# Patient Record
Sex: Male | Born: 1990 | Race: White | Hispanic: No | Marital: Married | Smoking: Current every day smoker
Health system: Southern US, Community
[De-identification: ages and names within clinical notes are randomized; demographics above are authoritative.]

## PROBLEM LIST (undated history)

## (undated) DIAGNOSIS — M109 Gout, unspecified: Secondary | ICD-10-CM

## (undated) DIAGNOSIS — J45909 Unspecified asthma, uncomplicated: Secondary | ICD-10-CM

## (undated) DIAGNOSIS — J42 Unspecified chronic bronchitis: Secondary | ICD-10-CM

## (undated) DIAGNOSIS — I10 Essential (primary) hypertension: Secondary | ICD-10-CM

---

## 2015-07-25 ENCOUNTER — Emergency Department (HOSPITAL_COMMUNITY)
Admission: EM | Admit: 2015-07-25 | Discharge: 2015-07-25 | Disposition: A | Discharge status: Home / Self Care | Payer: Self-pay | Attending: Emergency Medicine | Admitting: Emergency Medicine

## 2015-07-25 ENCOUNTER — Encounter (HOSPITAL_COMMUNITY): Payer: Self-pay | Admitting: Emergency Medicine

## 2015-07-25 DIAGNOSIS — M109 Gout, unspecified: Secondary | ICD-10-CM | POA: Insufficient documentation

## 2015-07-25 DIAGNOSIS — M25571 Pain in right ankle and joints of right foot: Secondary | ICD-10-CM | POA: Insufficient documentation

## 2015-07-25 HISTORY — DX: Gout, unspecified: M10.9

## 2015-07-25 MED ORDER — PREDNISONE 10 MG (21) PO TBPK
10.0000 mg | ORAL_TABLET | Freq: Every day | ORAL | Status: DC
Start: 2015-07-25 — End: 2019-11-18

## 2015-07-25 MED ORDER — PREDNISONE 50 MG PO TABS
50.0000 mg | ORAL_TABLET | Freq: Once | ORAL | Status: AC
  Administered 2015-07-25: 50 mg via ORAL
  Filled 2015-07-25: qty 1

## 2015-07-25 MED ORDER — NAPROXEN 500 MG PO TABS: 500.0000 mg | ORAL_TABLET | Freq: Two times a day (BID) | ORAL | Status: DC

## 2015-07-25 MED ORDER — OXYCODONE-ACETAMINOPHEN 5-325 MG PO TABS
1.0000 | ORAL_TABLET | Freq: Once | ORAL | Status: AC
  Administered 2015-07-25: 1 via ORAL
  Filled 2015-07-25: qty 1

## 2015-07-25 NOTE — ED Notes (Signed)
Patient reports hx of gout. Denies injury. No swelling/obvious deformity noted.

## 2015-07-25 NOTE — Discharge Instructions (Signed)
Ankle Pain Follow up with your primary care physician. Take naproxen for pain. Ankle pain is a common symptom. The bones, cartilage, tendons, and muscles of the ankle joint perform a lot of work each day. The ankle joint holds your body weight and allows you to move around. Ankle pain can occur on either side or back of 1 or both ankles. Ankle pain may be sharp and burning or dull and aching. There may be tenderness, stiffness, redness, or warmth around the ankle. The pain occurs more often when a person walks or puts pressure on the ankle. CAUSES  There are many reasons ankle pain can develop. It is important to work with your caregiver to identify the cause since many conditions can impact the bones, cartilage, muscles, and tendons. Causes for ankle pain include:  Injury, including a break (fracture), sprain, or strain often due to a fall, sports, or a high-impact activity.  Swelling (inflammation) of a tendon (tendonitis).  Achilles tendon rupture.  Ankle instability after repeated sprains and strains.  Poor foot alignment.  Pressure on a nerve (tarsal tunnel syndrome).  Arthritis in the ankle or the lining of the ankle.  Crystal formation in the ankle (gout or pseudogout). DIAGNOSIS  A diagnosis is based on your medical history, your symptoms, results of your physical exam, and results of diagnostic tests. Diagnostic tests may include X-ray exams or a computerized magnetic scan (magnetic resonance imaging, MRI). TREATMENT  Treatment will depend on the cause of your ankle pain and may include:  Keeping pressure off the ankle and limiting activities.  Using crutches or other walking support (a cane or brace).  Using rest, ice, compression, and elevation.  Participating in physical therapy or home exercises.  Wearing shoe inserts or special shoes.  Losing weight.  Taking medications to reduce pain or swelling or receiving an injection.  Undergoing surgery. HOME CARE  INSTRUCTIONS   Only take over-the-counter or prescription medicines for pain, discomfort, or fever as directed by your caregiver.  Put ice on the injured area.  Put ice in a plastic bag.  Place a towel between your skin and the bag.  Leave the ice on for 15-20 minutes at a time, 03-04 times a day.  Keep your leg raised (elevated) when possible to lessen swelling.  Avoid activities that cause ankle pain.  Follow specific exercises as directed by your caregiver.  Record how often you have ankle pain, the location of the pain, and what it feels like. This information may be helpful to you and your caregiver.  Ask your caregiver about returning to work or sports and whether you should drive.  Follow up with your caregiver for further examination, therapy, or testing as directed. SEEK MEDICAL CARE IF:   Pain or swelling continues or worsens beyond 1 week.  You have an oral temperature above 102 F (38.9 C).  You are feeling unwell or have chills.  You are having an increasingly difficult time with walking.  You have loss of sensation or other new symptoms.  You have questions or concerns. MAKE SURE YOU:   Understand these instructions.  Will watch your condition.  Will get help right away if you are not doing well or get worse. Document Released: 04/20/2010 Document Revised: 01/23/2012 Document Reviewed: 04/20/2010 [facility] Patient Information 2015 [place], [place]. This information is not intended to replace advice given to you by your health care provider. Make sure you discuss any questions you have with your health care provider.

## 2015-07-25 NOTE — ED Notes (Signed)
Right ankle pain, denies injury, hx of gout

## 2015-07-25 NOTE — ED Notes (Signed)
Instructed patient not to drive. Verbalizes understanding. 

## 2015-07-25 NOTE — ED Provider Notes (Signed)
CSN: [id number]     Arrival date & time 07/25/15  1520 History   First MD Initiated Contact with Patient 07/25/15 1532     Chief Complaint  Patient presents with  . Ankle Pain     (Consider location/radiation/quality/duration/timing/severity/associated sxs/prior Treatment) Patient is a 24 y.o. male presenting with ankle pain. The history is provided by the patient. No language interpreter was used.  Ankle Pain  Nicholas [name] is a 24 y.o male with a history of gout who presents for sudden onset right ankle pain after stepping out of bed this morning. Nicholas [name] states Nicholas [name] has had this pain before and this is how gout began. Nicholas [name] denies any recent injury or fall to the area. Nicholas [name] took ibuprofen this morning with minimal relief. Past Medical History  Diagnosis Date  . Gout    History reviewed. No pertinent past surgical history. No family history on file. Social History  Substance Use Topics  . Smoking status: Never Smoker   . Smokeless tobacco: None  . Alcohol Use: Yes     Comment: occ    Review of Systems  Musculoskeletal: Positive for arthralgias.  Skin: Negative for color change and rash.  Neurological: Negative for numbness.      Allergies  Review of patient's allergies indicates not on file.  Home Medications   Prior to Admission medications   Medication Sig Start Date End Date Taking? Authorizing Provider  naproxen (NAPROSYN) 500 MG tablet Take 1 tablet (500 mg total) by mouth 2 (two) times daily. 07/25/15   [place] Gustafson Patel-Mills, PA-C  predniSONE (STERAPRED UNI-PAK 21 TAB) 10 MG (21) TBPK tablet Take 1 tablet (10 mg total) by mouth daily. Take 6 tabs by mouth daily  for 2 days, then 4 tabs for 2 days, 2 tabs for 2 days, then 1 tab by mouth daily for 2 days 07/25/15   Caedyn Raygoza Patel-Mills, PA-C   BP 140/67 mmHg  Pulse 90  Temp(Src) 98.6 F (37 C)  Resp 18  Ht 6' (1.829 m)  Wt 265 lb (120.203 kg)  BMI 35.93 kg/m2  SpO2 100% Physical Exam  Constitutional: Nicholas [name] is oriented to person,  place, and time. Nicholas [name] appears well-developed and well-nourished.  HENT:  Head: Normocephalic and atraumatic.  Eyes: Conjunctivae are normal.  Neck: Neck supple.  Cardiovascular: Normal rate.   Pulmonary/Chest: Effort normal. No respiratory distress.  Musculoskeletal: Normal range of motion.  Right ankle: Tenderness to deep palpation of the lateral malleolus. No edema, erythema, ecchymosis, warmth or rash. Nicholas [name] is able to flex and extend his toes. Nicholas [name] is able to dorsi and plantar flex the ankle without difficulty. 2+ DP pulse. Less than 2 second capillary refill. No deformity. Nicholas [name] has no pain of the calcaneus, navicular, fifth metatarsal, or dorsum of the foot.  Neurological: Nicholas [name] is alert and oriented to person, place, and time.  Skin: Skin is warm and dry.  Psychiatric: Nicholas [name] has a normal mood and affect. His behavior is normal.  Nursing note and vitals reviewed.   ED Course  Procedures (including critical care time) Labs Review Labs Reviewed - No data to display  Imaging Review No results found.   EKG Interpretation None      MDM   Final diagnoses:  Acute right ankle pain   Patient presents for right ankle pain that began suddenly this morning when stepping out of bed. Nicholas [name] is well-appearing and in no acute distress. I do not believe imaging is necessary at this time. Nicholas [name] is ambulatory with steady gait.  This could possibly be a ligament sprain versus gout. Nicholas [name] states Nicholas [name] had gout in the same location previously. Patient was not given ankle ASO due to pain with compression. I discussed that Nicholas [name] would need to take anti-inflammatory's for pain. Return precautions were given. Nicholas [name] can follow up with his PCP and verbally agrees with the plan.     Catha Gosselin, PA-C 07/25/15 1557  Raeford Razor, MD 07/27/15 0800

## 2019-08-23 ENCOUNTER — Other Ambulatory Visit: Payer: Self-pay

## 2019-08-23 ENCOUNTER — Encounter (HOSPITAL_COMMUNITY): Payer: Self-pay

## 2019-08-23 ENCOUNTER — Emergency Department (HOSPITAL_COMMUNITY)
Admission: EM | Admit: 2019-08-23 | Discharge: 2019-08-23 | Disposition: A | Discharge status: Home / Self Care | Payer: Self-pay | Attending: Emergency Medicine | Admitting: Emergency Medicine

## 2019-08-23 DIAGNOSIS — M791 Myalgia, unspecified site: Secondary | ICD-10-CM | POA: Insufficient documentation

## 2019-08-23 DIAGNOSIS — R1084 Generalized abdominal pain: Secondary | ICD-10-CM | POA: Insufficient documentation

## 2019-08-23 DIAGNOSIS — F1910 Other psychoactive substance abuse, uncomplicated: Secondary | ICD-10-CM

## 2019-08-23 DIAGNOSIS — R5383 Other fatigue: Secondary | ICD-10-CM | POA: Insufficient documentation

## 2019-08-23 DIAGNOSIS — R112 Nausea with vomiting, unspecified: Secondary | ICD-10-CM

## 2019-08-23 LAB — COMPREHENSIVE METABOLIC PANEL
ALT: 26 U/L (ref 0–44)
AST: 22 U/L (ref 15–41)
Albumin: 4.2 g/dL (ref 3.5–5.0)
Alkaline Phosphatase: 66 U/L (ref 38–126)
Anion gap: 10 (ref 5–15)
BUN: 11 mg/dL (ref 6–20)
CO2: 26 mmol/L (ref 22–32)
Calcium: 9 mg/dL (ref 8.9–10.3)
Chloride: 102 mmol/L (ref 98–111)
Creatinine, Ser: 0.85 mg/dL (ref 0.61–1.24)
GFR calc Af Amer: >60 mL/min (ref >60)
GFR calc non Af Amer: >60 mL/min (ref >60)
Glucose, Bld: 90 mg/dL (ref 70–99)
Potassium: 4.3 mmol/L (ref 3.5–5.1)
Sodium: 138 mmol/L (ref 135–145)
Total Bilirubin: 1 mg/dL (ref 0.3–1.2)
Total Protein: 7.5 g/dL (ref 6.5–8.1)

## 2019-08-23 LAB — CBC
HCT: 50.4 % (ref 39.0–52.0)
Hemoglobin: 17 g/dL (ref 13.0–17.0)
MCH: 30.4 pg (ref 26.0–34.0)
MCHC: 33.7 g/dL (ref 30.0–36.0)
MCV: 90 fL (ref 80.0–100.0)
Platelets: 272 10*3/uL (ref 150–400)
RBC: 5.6 MIL/uL (ref 4.22–5.81)
RDW: 13.2 % (ref 11.5–15.5)
WBC: 9.1 10*3/uL (ref 4.0–10.5)
nRBC: 0 % (ref 0.0–0.2)

## 2019-08-23 LAB — LIPASE, BLOOD: Lipase: 26 U/L (ref 11–51)

## 2019-08-23 LAB — MAGNESIUM: Magnesium: 2.3 mg/dL (ref 1.7–2.4)

## 2019-08-23 MED ORDER — PROMETHAZINE HCL 25 MG PO TABS: 25.0000 mg | ORAL_TABLET | Freq: Four times a day (QID) | ORAL | 0 refills | Status: DC | PRN

## 2019-08-23 MED ORDER — LORAZEPAM 1 MG PO TABS
0.0000 mg | ORAL_TABLET | Freq: Four times a day (QID) | ORAL | Status: DC
  Administered 2019-08-23: 10:00:00 1 mg via ORAL
  Filled 2019-08-23: qty 1

## 2019-08-23 MED ORDER — VITAMIN B-1 100 MG PO TABS: 100.0000 mg | ORAL_TABLET | Freq: Every day | ORAL | Status: DC

## 2019-08-23 MED ORDER — SODIUM CHLORIDE 0.9 % IV BOLUS
1000.0000 mL | Freq: Once | INTRAVENOUS | Status: DC
Start: 2019-08-23 — End: 2019-08-23

## 2019-08-23 MED ORDER — METOCLOPRAMIDE HCL 5 MG/ML IJ SOLN
10.0000 mg | Freq: Once | INTRAMUSCULAR | Status: AC
  Administered 2019-08-23: 10:00:00 10 mg via INTRAVENOUS
  Filled 2019-08-23: qty 2

## 2019-08-23 MED ORDER — LORAZEPAM 1 MG PO TABS: 0.0000 mg | ORAL_TABLET | Freq: Two times a day (BID) | ORAL | Status: DC

## 2019-08-23 MED ORDER — M.V.I. ADULT IV INJ
INJECTION | Freq: Once | INTRAVENOUS | Status: AC
  Administered 2019-08-23: 10:00:00 via INTRAVENOUS
  Filled 2019-08-23: qty 1000

## 2019-08-23 MED ORDER — SODIUM CHLORIDE 0.9% FLUSH
3.0000 mL | Freq: Once | INTRAVENOUS | Status: AC
  Administered 2019-08-23: 3 mL via INTRAVENOUS

## 2019-08-23 MED ORDER — LORAZEPAM 2 MG/ML IJ SOLN: 0.0000 mg | Freq: Two times a day (BID) | INTRAMUSCULAR | Status: DC

## 2019-08-23 MED ORDER — LORAZEPAM 2 MG/ML IJ SOLN: 0.0000 mg | Freq: Four times a day (QID) | INTRAMUSCULAR | Status: DC

## 2019-08-23 MED ORDER — THIAMINE HCL 100 MG/ML IJ SOLN
100.0000 mg | Freq: Every day | INTRAMUSCULAR | Status: DC
  Administered 2019-08-23: 10:00:00 100 mg via INTRAVENOUS
  Filled 2019-08-23: qty 2

## 2019-08-23 NOTE — ED Triage Notes (Signed)
Patient c/o emesis since last night. Patient denies any diarrhea or abdominal pain.

## 2019-08-23 NOTE — ED Triage Notes (Signed)
Pt from rehab center - [facility]. Ambulatory and a&ox4.

## 2019-08-23 NOTE — Discharge Instructions (Addendum)
°  You were seen in the ED for nausea and vomiting after alcohol, cocaine, methamphetamine and marijuana cessation.   I suspect your symptoms are related to mild withdrawal symptoms. You had no signs of severe withdrawal   Use phenergan every 6 hours for nausea and vomiting. Stay hydrated.  Mild symptoms of withdrawal include nausea, vomiting, abdominal pain, headaches, tremors, sweats.  See resources attached, call and go to a facility that has inpatient and/or outpatient detox services   Return for severe headaches, vision changes, bright red blood or black vomit or stool, chest pain, shortness of breath, suicidal or homicidal thoughts, auditory or visual or tactile hallucinations, seizures, inability to control vomiting with nausea medicine  [facility] [address] [place], [place] [address] [place]  [place] [address] [place]   [address], [place] [address]  [phone]  [facility]  [address] [place], [place] [address]  [phone]

## 2019-08-23 NOTE — ED Notes (Signed)
Patient tolerated fluid challenge well. Given ativan PO.

## 2019-08-23 NOTE — ED Notes (Signed)
Pt denies any vomiting today. Pt states that he feels lethargic and achy. Pt reports that he took Meth, Cocaine, Ethol in the past 52. Pt reports that he entered a rehab yesterday [facility].

## 2019-08-23 NOTE — ED Provider Notes (Signed)
[facility]-EMERGENCY DEPT Provider Note   CSN: [id number] Arrival date & time: 08/23/19  2119     History   Chief Complaint Chief Complaint  Patient presents with  . Emesis    HPI Nicholas [name] is a 28 y.o. male with history of polysubstance abuse presents the ER for evaluation of nausea associated with vomiting and mild generalized abdominal soreness.  He began vomited 24 hours ago, last emesis at 1 AM.  He last ate lunch around noon yesterday but unable to keep anything down since then.  He thinks his abdominal soreness is from dry heaving.  No abdominal pain currently.  He thinks he is withdrawing from "everything".  States he was clean for a little over a year ago but unfortunately relapsed 3 months ago.  He has been using alcohol, cocaine, methamphetamines and marijuana.  No IV drug use.  He [facility] into [facility] yesterday and has been there for 1 day.  They do not do medical withdrawal treatment there.  He has had withdrawals in the past states before he used larger quantities of alcohol and states his withdrawals in the past were much worse than today.  He has had tremors and sweats.  He has never had hallucinations or seizures in the past when withdrawing.  Last used EtOH, marijuana, methamphetamines and cocaine 2 days ago. No headaches, tremors, hallucinations, seizures.   No associated fever, chest pain, shortness of breath, diarrhea, constipation, dysuria or current abdominal pain.  No hematemesis or melena.  No sick contacts.  No exposure to suspicious food.  No exposure to COVID-19 that he knows of.  No GI history in the past including surgeries.    HPI  Past Medical History:  Diagnosis Date  . Gout     There are no active problems to display for this patient.   History reviewed. No pertinent surgical history.      Home Medications    Prior to Admission medications   Medication Sig Start Date End Date Taking? Authorizing  Provider  naproxen (NAPROSYN) 500 MG tablet Take 1 tablet (500 mg total) by mouth 2 (two) times daily. Patient not taking: Reported on 08/23/2019 07/25/15   Patel-Mills, Lorelle Formosa, PA-C  predniSONE (STERAPRED UNI-PAK 21 TAB) 10 MG (21) TBPK tablet Take 1 tablet (10 mg total) by mouth daily. Take 6 tabs by mouth daily  for 2 days, then 4 tabs for 2 days, 2 tabs for 2 days, then 1 tab by mouth daily for 2 days Patient not taking: Reported on 08/23/2019 07/25/15   Patel-Mills, Lorelle Formosa, PA-C  promethazine (PHENERGAN) 25 MG tablet Take 1 tablet (25 mg total) by mouth every 6 (six) hours as needed for nausea or vomiting. 08/23/19   Liberty Handy, PA-C    Family History Family History  Problem Relation Age of Onset  . Hypertension Father     Social History Social History   Tobacco Use  . Smoking status: Never Smoker  . Smokeless tobacco: Never Used  Substance Use Topics  . Alcohol use: Yes    Comment: occ  . Drug use: Yes    Types: Marijuana, Cocaine, Methamphetamines    Comment: last usesd 3 days ago.     Allergies   Patient has no known allergies.   Review of Systems Review of Systems  Gastrointestinal: Positive for abdominal pain (resolved), nausea and vomiting.  All other systems reviewed and are negative.  Physical Exam Updated Vital Signs BP 127/75   Pulse  81   Temp 98.8 F (37.1 C) (Oral)   Resp 18   Ht 6' (1.829 m)   Wt 122.5 kg   SpO2 98%   BMI 36.62 kg/m   Physical Exam Vitals signs and nursing note reviewed.  Constitutional:      Appearance: He is well-developed.     Comments: Non toxic.  HENT:     Head: Normocephalic and atraumatic.     Nose: Nose normal.     Mouth/Throat:     Comments: MMM Eyes:     Conjunctiva/sclera: Conjunctivae normal.  Neck:     Musculoskeletal: Normal range of motion.  Cardiovascular:     Rate and Rhythm: Normal rate and regular rhythm.     Heart sounds: Normal heart sounds.  Pulmonary:     Effort: Pulmonary effort is normal.      Breath sounds: Normal breath sounds.  Abdominal:     General: Bowel sounds are normal.     Palpations: Abdomen is soft.     Tenderness: There is no abdominal tenderness.     Comments: No G/R/R. No suprapubic or CVA tenderness. Negative Murphy's and McBurney's  Musculoskeletal: Normal range of motion.  Skin:    General: Skin is warm and dry.     Capillary Refill: Capillary refill takes less than 2 seconds.     Comments: No diaphoresis   Neurological:     Mental Status: He is alert and oriented to person, place, and time.     Comments: No tremors   Psychiatric:        Behavior: Behavior normal.      ED Treatments / Results  Labs (all labs ordered are listed, but only abnormal results are displayed) Labs Reviewed  LIPASE, BLOOD  COMPREHENSIVE METABOLIC PANEL  CBC  MAGNESIUM    EKG None  Radiology No results found.  Procedures Procedures (including critical care time)  Medications Ordered in ED Medications  LORazepam (ATIVAN) injection 0-4 mg ( Intravenous See Alternative 08/23/19 1011)    Or  LORazepam (ATIVAN) tablet 0-4 mg (1 mg Oral Given 08/23/19 1011)  LORazepam (ATIVAN) injection 0-4 mg (has no administration in time range)    Or  LORazepam (ATIVAN) tablet 0-4 mg (has no administration in time range)  thiamine (VITAMIN B-1) tablet 100 mg ( Oral See Alternative 08/23/19 1013)    Or  thiamine (B-1) injection 100 mg (100 mg Intravenous Given 08/23/19 1013)  sodium chloride flush (NS) 0.9 % injection 3 mL (3 mLs Intravenous Given 08/23/19 1010)  metoCLOPramide (REGLAN) injection 10 mg (10 mg Intravenous Given 08/23/19 1012)  sodium chloride 0.9 % 1,000 mL with thiamine 100 mg, folic acid 1 mg, multivitamins adult 10 mL infusion ( Intravenous Stopped 08/23/19 1221)     Initial Impression / Assessment and Plan / ED Course  I have reviewed the triage vital signs and the nursing notes.  Pertinent labs & imaging results that were available during my care of the  patient were reviewed by me and considered in my medical decision making (see chart for details).  Patient EMR reviewed.   Highest on ddx is polysubstance withdrawal syndrome, mild. Last use 48 hours ago. History of the same in the past. No history of seizures, hallucinations or severe withdrawals.  Could be viral gastroenteritis.  No exposure to COVID, sick contacts.  Hx and exam not consistent with intraabdominal emergency like cholecystitis, pancreatitis, appendicitis, diverticulitis.  Exam reassuring. No tachycardia, hypertension, diaphoresis, severe dehydration, tremors.  MMM. No abd tenderness.  Will obtain labs. Give banana bag, antiemetics. On CIWA protocol. I don't think he needs bzd now. Will reassess.  1415: Labs reviewed by me unremarkable.  Patient has received banana bag.  Tolerating fluids without emesis.  CIWA score initially 5, decreased.  Patient reevaluated.  No other objective signs of withdrawal.  No signs of severe withdrawal symptoms.  Offered Librium but patient states facility does not accept this medicine.  Will discharge with antiemetic, oral hydration.  Return precautions discussed.  Patient is comfortable with this. Final Clinical Impressions(s) / ED Diagnoses   Final diagnoses:  Nausea and vomiting in adult patient  Polysubstance abuse [facility])    ED Discharge Orders         Ordered    promethazine (PHENERGAN) 25 MG tablet  Every 6 hours PRN     08/23/19 1152           Kinnie Feil, [place] 08/23/19 [place], Ankit, MD 08/23/19 1513

## 2019-08-30 ENCOUNTER — Emergency Department (HOSPITAL_COMMUNITY)
Admission: EM | Admit: 2019-08-30 | Discharge: 2019-08-30 | Disposition: A | Discharge status: Home / Self Care | Payer: Self-pay | Attending: Emergency Medicine | Admitting: Emergency Medicine

## 2019-08-30 ENCOUNTER — Encounter (HOSPITAL_COMMUNITY): Payer: Self-pay

## 2019-08-30 ENCOUNTER — Other Ambulatory Visit: Payer: Self-pay

## 2019-08-30 DIAGNOSIS — Y999 Unspecified external cause status: Secondary | ICD-10-CM | POA: Insufficient documentation

## 2019-08-30 DIAGNOSIS — X58XXXA Exposure to other specified factors, initial encounter: Secondary | ICD-10-CM | POA: Insufficient documentation

## 2019-08-30 DIAGNOSIS — Y939 Activity, unspecified: Secondary | ICD-10-CM | POA: Insufficient documentation

## 2019-08-30 DIAGNOSIS — S29012A Strain of muscle and tendon of back wall of thorax, initial encounter: Secondary | ICD-10-CM | POA: Insufficient documentation

## 2019-08-30 DIAGNOSIS — T148XXA Other injury of unspecified body region, initial encounter: Secondary | ICD-10-CM

## 2019-08-30 DIAGNOSIS — Y929 Unspecified place or not applicable: Secondary | ICD-10-CM | POA: Insufficient documentation

## 2019-08-30 NOTE — ED Triage Notes (Signed)
Patient reports that he is having lower back pain and reports an area of numbness of the lower back. Patient states the pain radiates into the top of his head. Patient denies any injuries or heavy lifting. MAE.

## 2019-08-30 NOTE — Discharge Instructions (Addendum)
Use ibuprofen as directed for pain 

## 2019-08-30 NOTE — ED Provider Notes (Signed)
[facility] DEPT Provider Note   CSN: [id number] Arrival date & time: 08/30/19  1209     History   Chief Complaint Chief Complaint  Patient presents with  . Back Pain    HPI Nicholas [name] is a 28 y.o. male.     28 year old male who presents with back pain times several days.  Pain is located at his left mid thoracic region and is worse with twisting.  No radicular symptoms.  No shortness of breath.  Pain is dull and better with remaining still.  No treatment use prior to arrival     Past Medical History:  Diagnosis Date  . Gout     There are no active problems to display for this patient.   History reviewed. No pertinent surgical history.      Home Medications    Prior to Admission medications   Medication Sig Start Date End Date Taking? Authorizing Provider  naproxen (NAPROSYN) 500 MG tablet Take 1 tablet (500 mg total) by mouth 2 (two) times daily. Patient not taking: Reported on 08/23/2019 07/25/15   Patel-Mills, Orvil Feil, PA-C  predniSONE (STERAPRED UNI-PAK 21 TAB) 10 MG (21) TBPK tablet Take 1 tablet (10 mg total) by mouth daily. Take 6 tabs by mouth daily  for 2 days, then 4 tabs for 2 days, 2 tabs for 2 days, then 1 tab by mouth daily for 2 days Patient not taking: Reported on 08/23/2019 07/25/15   Patel-Mills, Orvil Feil, PA-C  promethazine (PHENERGAN) 25 MG tablet Take 1 tablet (25 mg total) by mouth every 6 (six) hours as needed for nausea or vomiting. 08/23/19   Kinnie Feil, PA-C    Family History Family History  Problem Relation Age of Onset  . Hypertension Father     Social History Social History   Tobacco Use  . Smoking status: Never Smoker  . Smokeless tobacco: Never Used  Substance Use Topics  . Alcohol use: Yes    Comment: occ  . Drug use: Not Currently    Types: Marijuana, Cocaine, Methamphetamines     Allergies   Patient has no known allergies.   Review of Systems Review of Systems  All other  systems reviewed and are negative.    Physical Exam Updated Vital Signs BP 137/90 (BP Location: Right Arm)   Pulse 81   Temp 98.5 F (36.9 C) (Oral)   Resp 18   Ht 1.829 m (6')   Wt 122.5 kg   SpO2 99%   BMI 36.62 kg/m   Physical Exam Vitals signs and nursing note reviewed.  Constitutional:      General: He is not in acute distress.    Appearance: Normal appearance. He is well-developed. He is not toxic-appearing.  HENT:     Head: Normocephalic and atraumatic.  Eyes:     General: Lids are normal.     Conjunctiva/sclera: Conjunctivae normal.     Pupils: Pupils are equal, round, and reactive to light.  Neck:     Musculoskeletal: Normal range of motion and neck supple.     Thyroid: No thyroid mass.     Trachea: No tracheal deviation.  Cardiovascular:     Rate and Rhythm: Normal rate and regular rhythm.     Heart sounds: Normal heart sounds. No murmur. No gallop.   Pulmonary:     Effort: Pulmonary effort is normal. No respiratory distress.     Breath sounds: Normal breath sounds. No stridor. No decreased breath sounds, wheezing, rhonchi or rales.  Abdominal:     General: Bowel sounds are normal. There is no distension.     Palpations: Abdomen is soft.     Tenderness: There is no abdominal tenderness. There is no rebound.  Musculoskeletal: Normal range of motion.     Thoracic back: He exhibits tenderness. He exhibits no deformity.       Back:  Skin:    General: Skin is warm and dry.     Findings: No abrasion or rash.  Neurological:     Mental Status: He is alert and oriented to person, place, and time.     GCS: GCS eye subscore is 4. GCS verbal subscore is 5. GCS motor subscore is 6.     Cranial Nerves: No cranial nerve deficit.     Sensory: No sensory deficit.  Psychiatric:        Speech: Speech normal.        Behavior: Behavior normal.      ED Treatments / Results  Labs (all labs ordered are listed, but only abnormal results are displayed) Labs Reviewed -  No data to display  EKG None  Radiology No results found.  Procedures Procedures (including critical care time)  Medications Ordered in ED Medications - No data to display   Initial Impression / Assessment and Plan / ED Course  I have reviewed the triage vital signs and the nursing notes.  Pertinent labs & imaging results that were available during my care of the patient were reviewed by me and considered in my medical decision making (see chart for details).        Patient with MSK back pain.  Encourage patient to use ice therapy as well as ibuprofen return precautions given Final Clinical Impressions(s) / ED Diagnoses   Final diagnoses:  None    ED Discharge Orders    None       Lorre Nick, MD 08/30/19 1439

## 2019-09-27 ENCOUNTER — Encounter (HOSPITAL_COMMUNITY): Payer: Self-pay

## 2019-09-27 ENCOUNTER — Other Ambulatory Visit: Payer: Self-pay

## 2019-09-27 ENCOUNTER — Emergency Department (HOSPITAL_COMMUNITY)
Admission: EM | Admit: 2019-09-27 | Discharge: 2019-09-27 | Disposition: A | Discharge status: Home / Self Care | Payer: Self-pay | Attending: Emergency Medicine | Admitting: Emergency Medicine

## 2019-09-27 ENCOUNTER — Emergency Department (HOSPITAL_COMMUNITY): Payer: Self-pay

## 2019-09-27 DIAGNOSIS — Z79899 Other long term (current) drug therapy: Secondary | ICD-10-CM | POA: Insufficient documentation

## 2019-09-27 DIAGNOSIS — M7660 Achilles tendinitis, unspecified leg: Secondary | ICD-10-CM

## 2019-09-27 DIAGNOSIS — F17210 Nicotine dependence, cigarettes, uncomplicated: Secondary | ICD-10-CM | POA: Insufficient documentation

## 2019-09-27 NOTE — ED Triage Notes (Signed)
Patient c/o right foot pain x 3 days. Patient states he tore his achilles tendon several years ago. Pain is worse when he stands or walks.

## 2019-09-27 NOTE — ED Provider Notes (Signed)
[facility]-EMERGENCY DEPT Provider Note   CSN: [id number] Arrival date & time: 09/27/19  2952     History   Chief Complaint Chief Complaint  Patient presents with  . Foot Pain    HPI Nicholas [name] is a 28 y.o. male.     HPI Patient with right Achilles pain.  Has had for the last 3 days.  States years ago he tore his Achilles tendon playing football.  States he did not have to have surgery. Past Medical History:  Diagnosis Date  . Gout     There are no active problems to display for this patient.   History reviewed. No pertinent surgical history.      Home Medications    Prior to Admission medications   Medication Sig Start Date End Date Taking? Authorizing Provider  naproxen (NAPROSYN) 500 MG tablet Take 1 tablet (500 mg total) by mouth 2 (two) times daily. Patient not taking: Reported on 08/23/2019 07/25/15   Patel-Mills, Lorelle Formosa, PA-C  predniSONE (STERAPRED UNI-PAK 21 TAB) 10 MG (21) TBPK tablet Take 1 tablet (10 mg total) by mouth daily. Take 6 tabs by mouth daily  for 2 days, then 4 tabs for 2 days, 2 tabs for 2 days, then 1 tab by mouth daily for 2 days Patient not taking: Reported on 08/23/2019 07/25/15   Patel-Mills, Lorelle Formosa, PA-C  promethazine (PHENERGAN) 25 MG tablet Take 1 tablet (25 mg total) by mouth every 6 (six) hours as needed for nausea or vomiting. 08/23/19   Liberty Handy, PA-C    Family History Family History  Problem Relation Age of Onset  . Hypertension Father     Social History Social History   Tobacco Use  . Smoking status: Current Every Day Smoker    Packs/day: 0.50    Types: Cigarettes  . Smokeless tobacco: Never Used  Substance Use Topics  . Alcohol use: Yes    Comment: occ  . Drug use: Not Currently    Types: Marijuana, Cocaine, Methamphetamines     Allergies   Patient has no known allergies.   Review of Systems Review of Systems  Constitutional: Negative for appetite change.  Respiratory:  Negative for shortness of breath.   Musculoskeletal:       Right Achilles pain.  Neurological: Negative for weakness.  Psychiatric/Behavioral: Negative for confusion.     Physical Exam Updated Vital Signs BP (!) 131/95 (BP Location: Right Arm)   Pulse 82   Temp 98.3 F (36.8 C) (Oral)   Resp 14   Ht 6' (1.829 m)   Wt 127 kg   SpO2 100%   BMI 37.97 kg/m   Physical Exam Vitals signs and nursing note reviewed.  Musculoskeletal:     Comments: Mild tenderness of the right Achilles.  Good range of motion ankle.  Good flexion extension.  Good strength in plantar flexion at the ankle.  No tenderness over the calf.  Skin:    General: Skin is warm.  Neurological:     Mental Status: He is alert.      ED Treatments / Results  Labs (all labs ordered are listed, but only abnormal results are displayed) Labs Reviewed - No data to display  EKG None  Radiology Dg Foot Complete Right  Result Date: 09/27/2019 CLINICAL DATA:  Right foot pain EXAM: RIGHT FOOT COMPLETE - 3+ VIEW COMPARISON:  None. FINDINGS: There is no evidence of fracture or dislocation. There is no evidence of arthropathy or other focal bone abnormality. Soft  tissues are unremarkable. IMPRESSION: Negative. Electronically Signed   By: Franchot Gallo M.D.   On: 09/27/2019 09:45    Procedures Procedures (including critical care time)  Medications Ordered in ED Medications - No data to display   Initial Impression / Assessment and Plan / ED Course  I have reviewed the triage vital signs and the nursing notes.  Pertinent labs & imaging results that were available during my care of the patient were reviewed by me and considered in my medical decision making (see chart for details).        Patient with mild Achilles tendinitis.  Likely brought on due to the change in the weather from previous injury since there was no injury.  Think patient stable for discharge.  Outpatient follow-up as needed.  Final Clinical  Impressions(s) / ED Diagnoses   Final diagnoses:  Achilles tendon pain    ED Discharge Orders    None       Davonna Belling, MD 09/27/19 1055

## 2019-11-18 ENCOUNTER — Emergency Department
Admission: EM | Admit: 2019-11-18 | Discharge: 2019-11-18 | Disposition: A | Discharge status: Home / Self Care | Payer: Self-pay | Attending: Emergency Medicine | Admitting: Emergency Medicine

## 2019-11-18 ENCOUNTER — Encounter: Payer: Self-pay | Admitting: Emergency Medicine

## 2019-11-18 ENCOUNTER — Other Ambulatory Visit: Payer: Self-pay

## 2019-11-18 DIAGNOSIS — J45909 Unspecified asthma, uncomplicated: Secondary | ICD-10-CM | POA: Insufficient documentation

## 2019-11-18 DIAGNOSIS — B349 Viral infection, unspecified: Secondary | ICD-10-CM | POA: Insufficient documentation

## 2019-11-18 DIAGNOSIS — Z20822 Contact with and (suspected) exposure to covid-19: Secondary | ICD-10-CM | POA: Insufficient documentation

## 2019-11-18 DIAGNOSIS — F17210 Nicotine dependence, cigarettes, uncomplicated: Secondary | ICD-10-CM | POA: Insufficient documentation

## 2019-11-18 HISTORY — DX: Unspecified asthma, uncomplicated: J45.909

## 2019-11-18 HISTORY — DX: Unspecified chronic bronchitis: J42

## 2019-11-18 LAB — INFLUENZA PANEL BY PCR (TYPE A & B)
Influenza A By PCR: NEGATIVE
Influenza B By PCR: NEGATIVE

## 2019-11-18 MED ORDER — PREDNISONE 10 MG (21) PO TBPK: ORAL_TABLET | ORAL | 0 refills | Status: DC

## 2019-11-18 NOTE — ED Notes (Signed)
NAD noted at time of D/C. Pt denies questions or concerns. Pt ambulatory to the lobby at this time. Unable to E-sig due to E-sig pad not working, verbal consent for D/C obtained.

## 2019-11-18 NOTE — ED Provider Notes (Signed)
[facility] Emergency Department Provider Note  ____________________________________________  Time seen: Approximately 1:51 PM  I have reviewed the triage vital signs and the nursing notes.   HISTORY  Chief Complaint Generalized Body Aches and Shortness of Breath   HPI Ruddy Kana is a 29 y.o. male presents to the emergency department for treatment and evaluation of body aches and shortness of breath. Symptoms started last night. Little relief with albuterol inhaler. History of asthma and bronchitis.  No known COVID-19 exposure.   Past Medical History:  Diagnosis Date  . Asthma   . Chronic bronchitis (HCC)   . Gout     There are no problems to display for this patient.   History reviewed. No pertinent surgical history.  Prior to Admission medications   Medication Sig Start Date End Date Taking? Authorizing Provider  naproxen (NAPROSYN) 500 MG tablet Take 1 tablet (500 mg total) by mouth 2 (two) times daily. Patient not taking: Reported on 08/23/2019 07/25/15   Patel-Mills, Lorelle Formosa, PA-C  predniSONE (STERAPRED UNI-PAK 21 TAB) 10 MG (21) TBPK tablet Take 6 tablets on the first day and decrease by 1 tablet each day until finished. 11/18/19   Petronella Shuford, Kasandra Knudsen, FNP  promethazine (PHENERGAN) 25 MG tablet Take 1 tablet (25 mg total) by mouth every 6 (six) hours as needed for nausea or vomiting. 08/23/19   Liberty Handy, PA-C    Allergies Patient has no known allergies.  Family History  Problem Relation Age of Onset  . Hypertension Father     Social History Social History   Tobacco Use  . Smoking status: Current Every Day Smoker    Packs/day: 0.50    Types: Cigarettes  . Smokeless tobacco: Never Used  Substance Use Topics  . Alcohol use: Yes    Comment: occ  . Drug use: Not Currently    Types: Marijuana, Cocaine, Methamphetamines    Review of Systems Constitutional: Negative for fever/chills. normal appetite. ENT: No sore  throat. Cardiovascular: Denies chest pain. Respiratory: Positive for shortness of breath. Positive for cough. Negative for wheezing.  Gastrointestinal: Negative for nausea,  no vomiting.  no diarrhea.  Musculoskeletal: Positive  for body aches Skin: Neative for rash. Neurological: Negative for headaches ____________________________________________   PHYSICAL EXAM:  VITAL SIGNS: ED Triage Vitals [11/18/19 1305]  Enc Vitals Group     BP (!) 154/86     Pulse Rate 89     Resp 16     Temp 98.2 F (36.8 C)     Temp Source Oral     SpO2 100 %     Weight 250 lb (113.4 kg)     Height 6' (1.829 m)     Head Circumference      Peak Flow      Pain Score 0     Pain Loc      Pain Edu?      Excl. in GC?     Constitutional: Alert and oriented. Well appearing and in no acute distress. Eyes: Conjunctivae are normal. Ears: TM normal Nose: no sinus congestion noted; no rhinnorhea. Mouth/Throat: Mucous membranes are moist.  Oropharynx clear. Tonsils flat. Uvula midline. Neck: No stridor.  Lymphatic: No cervical lymphadenopathy. Cardiovascular: Normal rate, regular rhythm. Good peripheral circulation. Respiratory: Respirations are even and unlabored.  No retractions. Breath sounds clear. Gastrointestinal: Soft and nontender.  Musculoskeletal: FROM x 4 extremities.  Neurologic:  Normal speech and language. Skin:  Skin is warm, dry and intact. No rash noted. Psychiatric:  Mood and affect are normal. Speech and behavior are normal.  ____________________________________________   LABS (all labs ordered are listed, but only abnormal results are displayed)  Labs Reviewed  SARS CORONAVIRUS 2 (TAT 6-24 HRS)  INFLUENZA PANEL BY PCR (TYPE A & B)   ____________________________________________  EKG  Not indicated. ____________________________________________  RADIOLOGY  Not indicated. ____________________________________________   PROCEDURES  Procedure(s) performed:  None  Critical Care performed: No ____________________________________________   INITIAL IMPRESSION / ASSESSMENT AND PLAN / ED COURSE  29 y.o. male presents to the ER for treatment and evaluation of symptoms as described in HPI. He will be tested for influenza and COVID-19. Prescription for prednisone sent to his pharmacy. He is to continue using his inhaler if needed. He was given quarantine instructions. He was made aware to follow up with primary care or return to the ER for symptoms that change or worsen.   Gorman Safi was evaluated in Emergency Department on 11/18/2019 for the symptoms described in the history of present illness. He was evaluated in the context of the global COVID-19 pandemic, which necessitated consideration that the patient might be at risk for infection with the SARS-CoV-2 virus that causes COVID-19. Institutional protocols and algorithms that pertain to the evaluation of patients at risk for COVID-19 are in a state of rapid change based on information released by regulatory bodies including the CDC and federal and state organizations. These policies and algorithms were followed during the patient's care in the ED.     Medications - No data to display  ED Discharge Orders         Ordered    predniSONE (STERAPRED UNI-PAK 21 TAB) 10 MG (21) TBPK tablet     11/18/19 1400           Pertinent labs & imaging results that were available during my care of the patient were reviewed by me and considered in my medical decision making (see chart for details).    If controlled substance prescribed during this visit, 12 month history viewed on the [place] prior to issuing an initial prescription for Schedule II or III opiod. ____________________________________________   FINAL CLINICAL IMPRESSION(S) / ED DIAGNOSES  Final diagnoses:  Viral syndrome    Note:  This document was prepared using Dragon voice recognition software and may include unintentional dictation  errors.    Victorino Dike, FNP 11/18/19 1525    Arta Silence, MD 11/18/19 1529

## 2019-11-18 NOTE — ED Triage Notes (Signed)
Says started with short of breath last night and body aches.  No fever. Says he has asthma and chronic bronchitis history

## 2019-11-18 NOTE — ED Notes (Signed)
Per NP no repeat VS needed at this time.

## 2019-11-18 NOTE — ED Notes (Signed)
Pt presents to ED via POV with c/o generalized body aches and SOB that started last night, pt denies known covid exposure, states current smoker, hx of asthma and bronchitis. No dyspnea with exertion, RR even and unlabored, pt able to speak in complete sentences without difficulty at this time.

## 2019-11-19 LAB — SARS CORONAVIRUS 2 (TAT 6-24 HRS): SARS Coronavirus 2: NEGATIVE

## 2019-11-25 ENCOUNTER — Emergency Department (HOSPITAL_COMMUNITY)
Admission: EM | Admit: 2019-11-25 | Discharge: 2019-11-26 | Disposition: A | Discharge status: Home / Self Care | Payer: Self-pay | Attending: Emergency Medicine | Admitting: Emergency Medicine

## 2019-11-25 ENCOUNTER — Other Ambulatory Visit: Payer: Self-pay

## 2019-11-25 ENCOUNTER — Encounter (HOSPITAL_COMMUNITY): Payer: Self-pay

## 2019-11-25 DIAGNOSIS — Z79899 Other long term (current) drug therapy: Secondary | ICD-10-CM | POA: Insufficient documentation

## 2019-11-25 DIAGNOSIS — Z20822 Contact with and (suspected) exposure to covid-19: Secondary | ICD-10-CM | POA: Insufficient documentation

## 2019-11-25 DIAGNOSIS — J45909 Unspecified asthma, uncomplicated: Secondary | ICD-10-CM | POA: Insufficient documentation

## 2019-11-25 DIAGNOSIS — F1910 Other psychoactive substance abuse, uncomplicated: Secondary | ICD-10-CM | POA: Insufficient documentation

## 2019-11-25 DIAGNOSIS — F17210 Nicotine dependence, cigarettes, uncomplicated: Secondary | ICD-10-CM | POA: Insufficient documentation

## 2019-11-25 NOTE — ED Triage Notes (Signed)
Patient arrived stating that he was in withdrawal earlier and decided to use meth at 930pm. Reports coming here for detox. Denies any medical complaints or SI or HI

## 2019-11-26 LAB — SALICYLATE LEVEL: Salicylate Lvl: <7 mg/dL — ABNORMAL LOW (ref 7.0–30.0)

## 2019-11-26 LAB — RAPID URINE DRUG SCREEN, HOSP PERFORMED
Amphetamines: POSITIVE — AB
Barbiturates: NOT DETECTED
Benzodiazepines: NOT DETECTED
Cocaine: NOT DETECTED
Opiates: NOT DETECTED
Tetrahydrocannabinol: POSITIVE — AB

## 2019-11-26 LAB — COMPREHENSIVE METABOLIC PANEL
ALT: 25 U/L (ref 0–44)
AST: 23 U/L (ref 15–41)
Albumin: 5.1 g/dL — ABNORMAL HIGH (ref 3.5–5.0)
Alkaline Phosphatase: 69 U/L (ref 38–126)
Anion gap: 11 (ref 5–15)
BUN: 12 mg/dL (ref 6–20)
CO2: 25 mmol/L (ref 22–32)
Calcium: 9.4 mg/dL (ref 8.9–10.3)
Chloride: 104 mmol/L (ref 98–111)
Creatinine, Ser: 1.09 mg/dL (ref 0.61–1.24)
GFR calc Af Amer: >60 mL/min (ref >60)
GFR calc non Af Amer: >60 mL/min (ref >60)
Glucose, Bld: 99 mg/dL (ref 70–99)
Potassium: 3.6 mmol/L (ref 3.5–5.1)
Sodium: 140 mmol/L (ref 135–145)
Total Bilirubin: 1.3 mg/dL — ABNORMAL HIGH (ref 0.3–1.2)
Total Protein: 8.3 g/dL — ABNORMAL HIGH (ref 6.5–8.1)

## 2019-11-26 LAB — CBC
HCT: 48.8 % (ref 39.0–52.0)
Hemoglobin: 17.2 g/dL — ABNORMAL HIGH (ref 13.0–17.0)
MCH: 31.6 pg (ref 26.0–34.0)
MCHC: 35.2 g/dL (ref 30.0–36.0)
MCV: 89.5 fL (ref 80.0–100.0)
Platelets: 299 10*3/uL (ref 150–400)
RBC: 5.45 MIL/uL (ref 4.22–5.81)
RDW: 12.7 % (ref 11.5–15.5)
WBC: 17.2 10*3/uL — ABNORMAL HIGH (ref 4.0–10.5)
nRBC: 0 % (ref 0.0–0.2)

## 2019-11-26 LAB — ACETAMINOPHEN LEVEL: Acetaminophen (Tylenol), Serum: <10 ug/mL — ABNORMAL LOW (ref 10–30)

## 2019-11-26 LAB — ETHANOL: Alcohol, Ethyl (B): <10 mg/dL (ref <10)

## 2019-11-26 LAB — SARS CORONAVIRUS 2 (TAT 6-24 HRS): SARS Coronavirus 2: NEGATIVE

## 2019-11-26 MED ORDER — DIPHENOXYLATE-ATROPINE 2.5-0.025 MG PO TABS: 1.0000 | ORAL_TABLET | Freq: Four times a day (QID) | ORAL | 0 refills | Status: AC | PRN

## 2019-11-26 MED ORDER — DICYCLOMINE HCL 20 MG PO TABS: 20.0000 mg | ORAL_TABLET | Freq: Three times a day (TID) | ORAL | 0 refills | Status: AC | PRN

## 2019-11-26 MED ORDER — CLONIDINE HCL 0.2 MG PO TABS: 0.2000 mg | ORAL_TABLET | Freq: Two times a day (BID) | ORAL | 0 refills | Status: AC | PRN

## 2019-11-26 MED ORDER — PROMETHAZINE HCL 25 MG PO TABS: 25.0000 mg | ORAL_TABLET | Freq: Four times a day (QID) | ORAL | 0 refills | Status: AC | PRN

## 2019-11-26 NOTE — Discharge Instructions (Signed)
Your labs have been provided for you in case you need them for treatment.  Your Covid test should be available tomorrow, you can access it in your MyChart.

## 2019-11-26 NOTE — ED Provider Notes (Signed)
Nicholas [name]   CSN: [id number] Arrival date & time: 11/25/19  2248     History Chief Complaint  Patient presents with  . Detox    Nicholas [name] is a 29 y.o. male.  Patient presents to the emergency department asking for help with substance abuse.  Patient reports that he has a history of problems with crack cocaine and pain pills.  He has previously been through rehab.  He reports that he did have a year clean previously but has relapsed.  He was trying to get off the pain pills earlier tonight and started to have symptoms of withdrawal, used methamphetamine.  This is not a typical drug abuse for him.  Patient comes in tonight asking for help with detox.  He talked to some rehab centers and was told that he could come in if he can get through a detox program first.  Patient denies homicidality and suicidality.        Past Medical History:  Diagnosis Date  . Asthma   . Chronic bronchitis (HCC)   . Gout     There are no problems to display for this patient.   History reviewed. No pertinent surgical history.     Family History  Problem Relation Age of Onset  . Hypertension Father     Social History   Tobacco Use  . Smoking status: Current Every Day Smoker    Packs/day: 0.50    Types: Cigarettes  . Smokeless tobacco: Never Used  Substance Use Topics  . Alcohol use: Yes    Comment: occ  . Drug use: Not Currently    Types: Marijuana, Cocaine, Methamphetamines    Home Medications Prior to Admission medications   Medication Sig Start Date End Date Taking? Authorizing Provider  cloNIDine (CATAPRES) 0.2 MG tablet Take 1 tablet (0.2 mg total) by mouth 2 (two) times daily as needed (withdrawal symptoms). 11/26/19   Gilda Crease, MD  dicyclomine (BENTYL) 20 MG tablet Take 1 tablet (20 mg total) by mouth 3 (three) times daily as needed (abdominal pain/spasm). 11/26/19   Gilda Crease, MD   diphenoxylate-atropine (LOMOTIL) 2.5-0.025 MG tablet Take 1-2 tablets by mouth 4 (four) times daily as needed for diarrhea or loose stools. 11/26/19   Gilda Crease, MD  promethazine (PHENERGAN) 25 MG tablet Take 1 tablet (25 mg total) by mouth every 6 (six) hours as needed for nausea or vomiting. 11/26/19   Nicholas [name], Canary Brim, MD    Allergies    Patient has no known allergies.  Review of Systems   Review of Systems  Psychiatric/Behavioral: Negative for suicidal ideas.  All other systems reviewed and are negative.   Physical Exam Updated Vital Signs BP (!) 153/96 (BP Location: Left Arm)   Pulse (!) 118   Temp 98.7 F (37.1 C) (Oral)   Resp 17   Ht 6' (1.829 m)   Wt 113.4 kg   SpO2 97%   BMI 33.91 kg/m   Physical Exam Vitals and nursing [name] reviewed.  Constitutional:      General: He is not in acute distress.    Appearance: Normal appearance. He is well-developed.  HENT:     Head: Normocephalic and atraumatic.     Right Ear: Hearing normal.     Left Ear: Hearing normal.     Nose: Nose normal.  Eyes:     Conjunctiva/sclera: Conjunctivae normal.     Pupils: Pupils are equal, round, and reactive to light.  Cardiovascular:     Rate and Rhythm: Regular rhythm.     Heart sounds: S1 normal and S2 normal. No murmur. No friction rub. No gallop.   Pulmonary:     Effort: Pulmonary effort is normal. No respiratory distress.     Breath sounds: Normal breath sounds.  Chest:     Chest wall: No tenderness.  Abdominal:     General: Bowel sounds are normal.     Palpations: Abdomen is soft.     Tenderness: There is no abdominal tenderness. There is no guarding or rebound. Negative signs include Murphy's sign and McBurney's sign.     Hernia: No hernia is present.  Musculoskeletal:        General: Normal range of motion.     Cervical back: Normal range of motion and neck supple.  Skin:    General: Skin is warm and dry.     Findings: No rash.  Neurological:      Mental Status: He is alert and oriented to person, place, and time.     GCS: GCS eye subscore is 4. GCS verbal subscore is 5. GCS motor subscore is 6.     Cranial Nerves: No cranial nerve deficit.     Sensory: No sensory deficit.     Coordination: Coordination normal.  Psychiatric:        Speech: Speech normal.        Behavior: Behavior normal.        Thought Content: Thought content normal.     ED Results / Procedures / Treatments   Labs (all labs ordered are listed, but only abnormal results are displayed) Labs Reviewed  CBC - Abnormal; Notable for the following components:      Result Value   WBC 17.2 (*)    Hemoglobin 17.2 (*)    All other components within normal limits  COMPREHENSIVE METABOLIC PANEL - Abnormal; Notable for the following components:   Total Protein 8.3 (*)    Albumin 5.1 (*)    Total Bilirubin 1.3 (*)    All other components within normal limits  RAPID URINE DRUG SCREEN, HOSP PERFORMED - Abnormal; Notable for the following components:   Amphetamines POSITIVE (*)    Tetrahydrocannabinol POSITIVE (*)    All other components within normal limits  ACETAMINOPHEN LEVEL - Abnormal; Notable for the following components:   Acetaminophen (Tylenol), Serum <10 (*)    All other components within normal limits  SALICYLATE LEVEL - Abnormal; Notable for the following components:   Salicylate Lvl <6.7 (*)    All other components within normal limits  SARS CORONAVIRUS 2 (TAT 6-24 HRS)  ETHANOL    EKG None  Radiology No results found.  Procedures Procedures (including critical care time)  Medications Ordered in ED Medications - No data to display  ED Course  I have reviewed the triage vital signs and the nursing notes.  Pertinent labs & imaging results that were available during my care of the patient were reviewed by me and considered in my medical decision making (see chart for details).    MDM Rules/Calculators/A&P                      Patient  presents to the emergency department for evaluation of abuse of opioid pain medications as well as crack cocaine.  He did use methamphetamine tonight but this is not a normal drug of abuse for him.  He is asking for detox.  Patient was informed that I  do not have a way to place him directly in detox but he was amenable to receiving resources.  [facility] lab work was ordered and will be provided to him.  He will also be given prescriptions for symptomatic relief of opioid withdrawal. Final Clinical Impression(s) / ED Diagnoses Final diagnoses:  Polysubstance abuse (HCC)    Rx / DC Orders ED Discharge Orders         Ordered    diphenoxylate-atropine (LOMOTIL) 2.5-0.025 MG tablet  4 times daily PRN     11/26/19 0153    promethazine (PHENERGAN) 25 MG tablet  Every 6 hours PRN     11/26/19 0153    cloNIDine (CATAPRES) 0.2 MG tablet  2 times daily PRN     11/26/19 0153    dicyclomine (BENTYL) 20 MG tablet  3 times daily PRN     11/26/19 0153           Gilda Crease, MD 11/26/19 [phone]

## 2019-11-26 NOTE — ED Notes (Signed)
Pt attempting to give urine specimen

## 2019-12-10 ENCOUNTER — Emergency Department: Payer: Self-pay

## 2019-12-10 ENCOUNTER — Other Ambulatory Visit: Payer: Self-pay

## 2019-12-10 ENCOUNTER — Emergency Department
Admission: EM | Admit: 2019-12-10 | Discharge: 2019-12-10 | Disposition: A | Discharge status: Home / Self Care | Payer: Self-pay | Attending: Emergency Medicine | Admitting: Emergency Medicine

## 2019-12-10 ENCOUNTER — Encounter: Payer: Self-pay | Admitting: Emergency Medicine

## 2019-12-10 DIAGNOSIS — R519 Headache, unspecified: Secondary | ICD-10-CM | POA: Insufficient documentation

## 2019-12-10 DIAGNOSIS — Y9389 Activity, other specified: Secondary | ICD-10-CM | POA: Insufficient documentation

## 2019-12-10 DIAGNOSIS — Y929 Unspecified place or not applicable: Secondary | ICD-10-CM | POA: Insufficient documentation

## 2019-12-10 DIAGNOSIS — S60221A Contusion of right hand, initial encounter: Secondary | ICD-10-CM | POA: Insufficient documentation

## 2019-12-10 DIAGNOSIS — Y998 Other external cause status: Secondary | ICD-10-CM | POA: Insufficient documentation

## 2019-12-10 DIAGNOSIS — F17210 Nicotine dependence, cigarettes, uncomplicated: Secondary | ICD-10-CM | POA: Insufficient documentation

## 2019-12-10 DIAGNOSIS — J45909 Unspecified asthma, uncomplicated: Secondary | ICD-10-CM | POA: Insufficient documentation

## 2019-12-10 DIAGNOSIS — I10 Essential (primary) hypertension: Secondary | ICD-10-CM | POA: Insufficient documentation

## 2019-12-10 HISTORY — DX: Essential (primary) hypertension: I10

## 2019-12-10 MED ORDER — TETANUS-DIPHTH-ACELL PERTUSSIS 5-2.5-18.5 LF-MCG/0.5 IM SUSP: 0.5000 mL | Freq: Once | INTRAMUSCULAR | Status: DC

## 2019-12-10 MED ORDER — AMOXICILLIN-POT CLAVULANATE 875-125 MG PO TABS: 1.0000 | ORAL_TABLET | Freq: Two times a day (BID) | ORAL | 0 refills | Status: AC

## 2019-12-10 MED ORDER — AMOXICILLIN-POT CLAVULANATE 875-125 MG PO TABS
1.0000 | ORAL_TABLET | Freq: Once | ORAL | Status: AC
  Administered 2019-12-10: 1 via ORAL
  Filled 2019-12-10: qty 1
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
## 2019-12-11 ENCOUNTER — Encounter: Payer: Self-pay | Admitting: Physician Assistant
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
